# Patient Record
Sex: Female | Born: 1945 | Race: Black or African American | Hispanic: No | Marital: Married | Smoking: Former smoker
Health system: Southern US, Community
[De-identification: ages and names within clinical notes are randomized; demographics above are authoritative.]

## PROBLEM LIST (undated history)

## (undated) DIAGNOSIS — I10 Essential (primary) hypertension: Secondary | ICD-10-CM

## (undated) HISTORY — PX: BREAST SURGERY: SHX581

## (undated) HISTORY — PX: TUBAL LIGATION: SHX77

## (undated) HISTORY — PX: APPENDECTOMY: SHX54

## (undated) HISTORY — PX: TONSILLECTOMY: SUR1361

---

## 2008-09-21 HISTORY — PX: BREAST EXCISIONAL BIOPSY: SUR124

## 2008-11-02 ENCOUNTER — Encounter: Admission: RE | Admit: 2008-11-02 | Discharge: 2008-11-02 | Payer: Self-pay | Admitting: Obstetrics and Gynecology

## 2008-11-15 ENCOUNTER — Encounter: Admission: RE | Admit: 2008-11-15 | Discharge: 2008-11-15 | Payer: Self-pay | Admitting: Obstetrics and Gynecology

## 2008-12-07 ENCOUNTER — Encounter: Admission: RE | Admit: 2008-12-07 | Discharge: 2008-12-07 | Payer: Self-pay | Admitting: General Surgery

## 2008-12-11 ENCOUNTER — Ambulatory Visit (HOSPITAL_BASED_OUTPATIENT_CLINIC_OR_DEPARTMENT_OTHER): Admission: RE | Admit: 2008-12-11 | Discharge: 2008-12-11 | Payer: Self-pay | Admitting: General Surgery

## 2008-12-11 ENCOUNTER — Encounter (INDEPENDENT_AMBULATORY_CARE_PROVIDER_SITE_OTHER): Payer: Self-pay | Admitting: General Surgery

## 2009-07-10 ENCOUNTER — Encounter: Admission: RE | Admit: 2009-07-10 | Discharge: 2009-07-10 | Payer: Self-pay | Admitting: Obstetrics and Gynecology

## 2010-03-17 IMAGING — MG MM DIAGNOSTIC UNILATERAL L
3 series · 3 of 3 positions shown · non-contrast
Comparison: January 06, 2007

CLINICAL DATA: Spontaneous bloody discharge from the left nipple

DIGITAL DIAGNOSTIC LEFT MAMMOGRAM

[L CC]
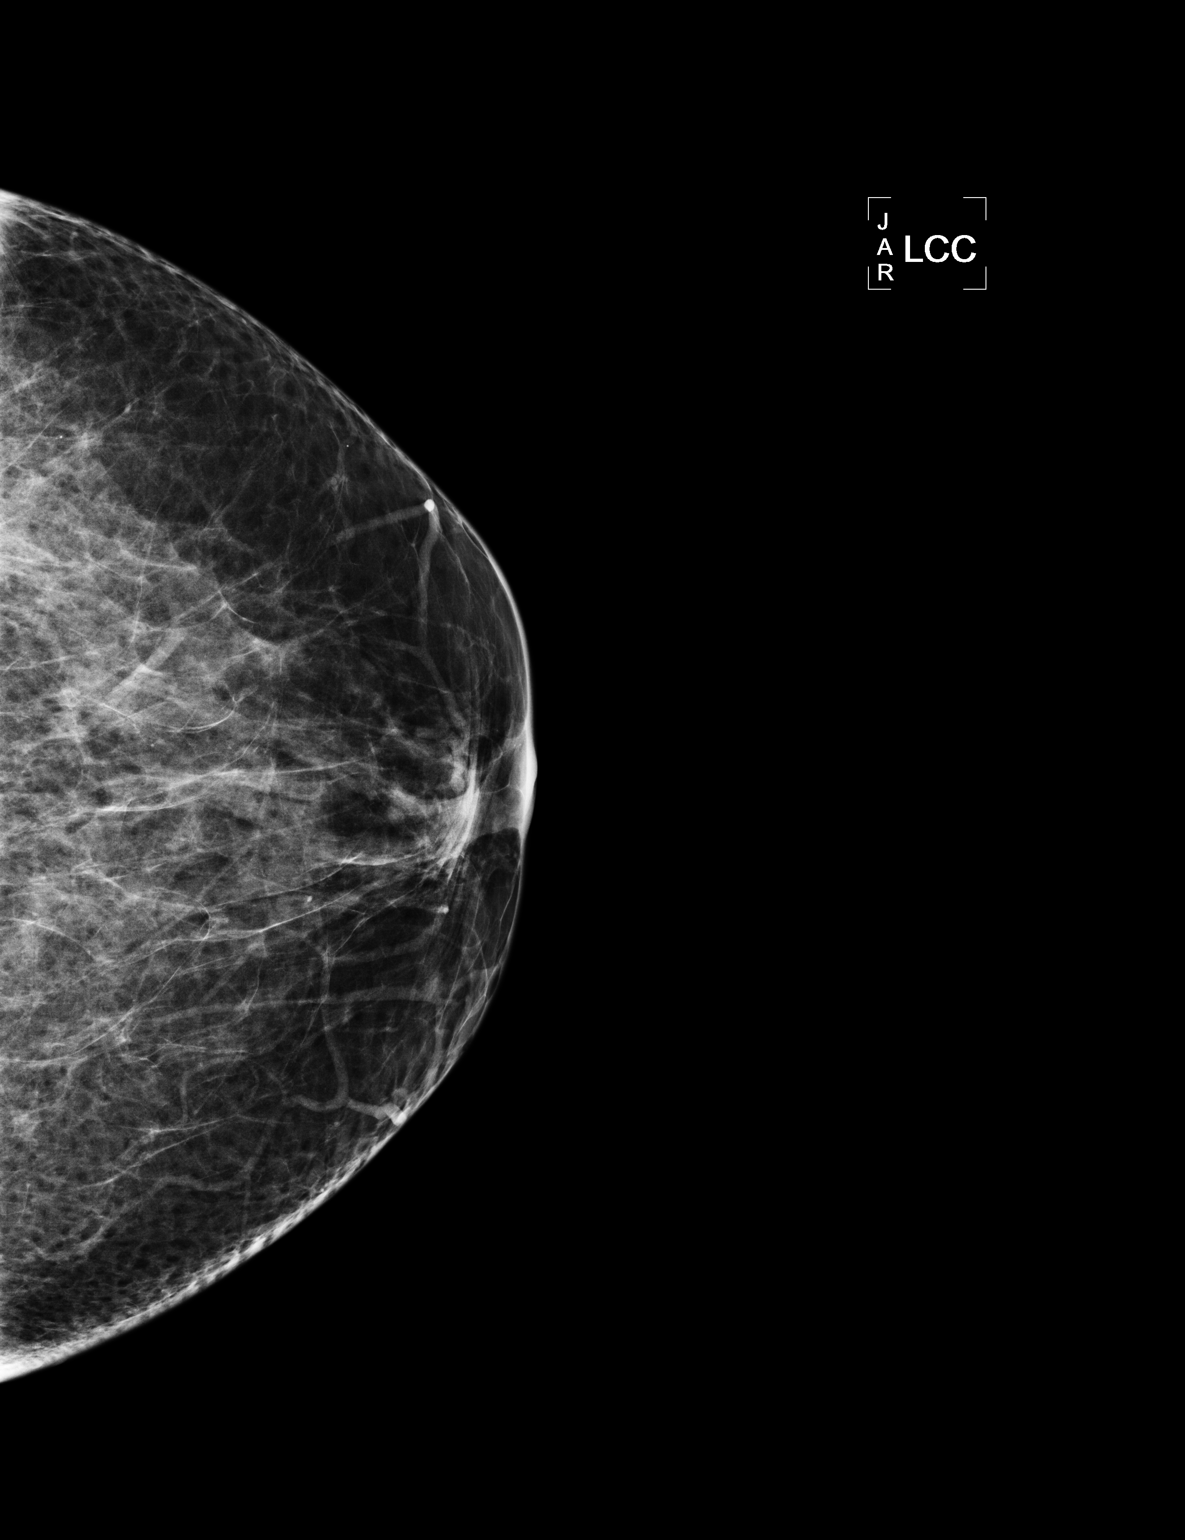

[L MLO (1 of 2)]
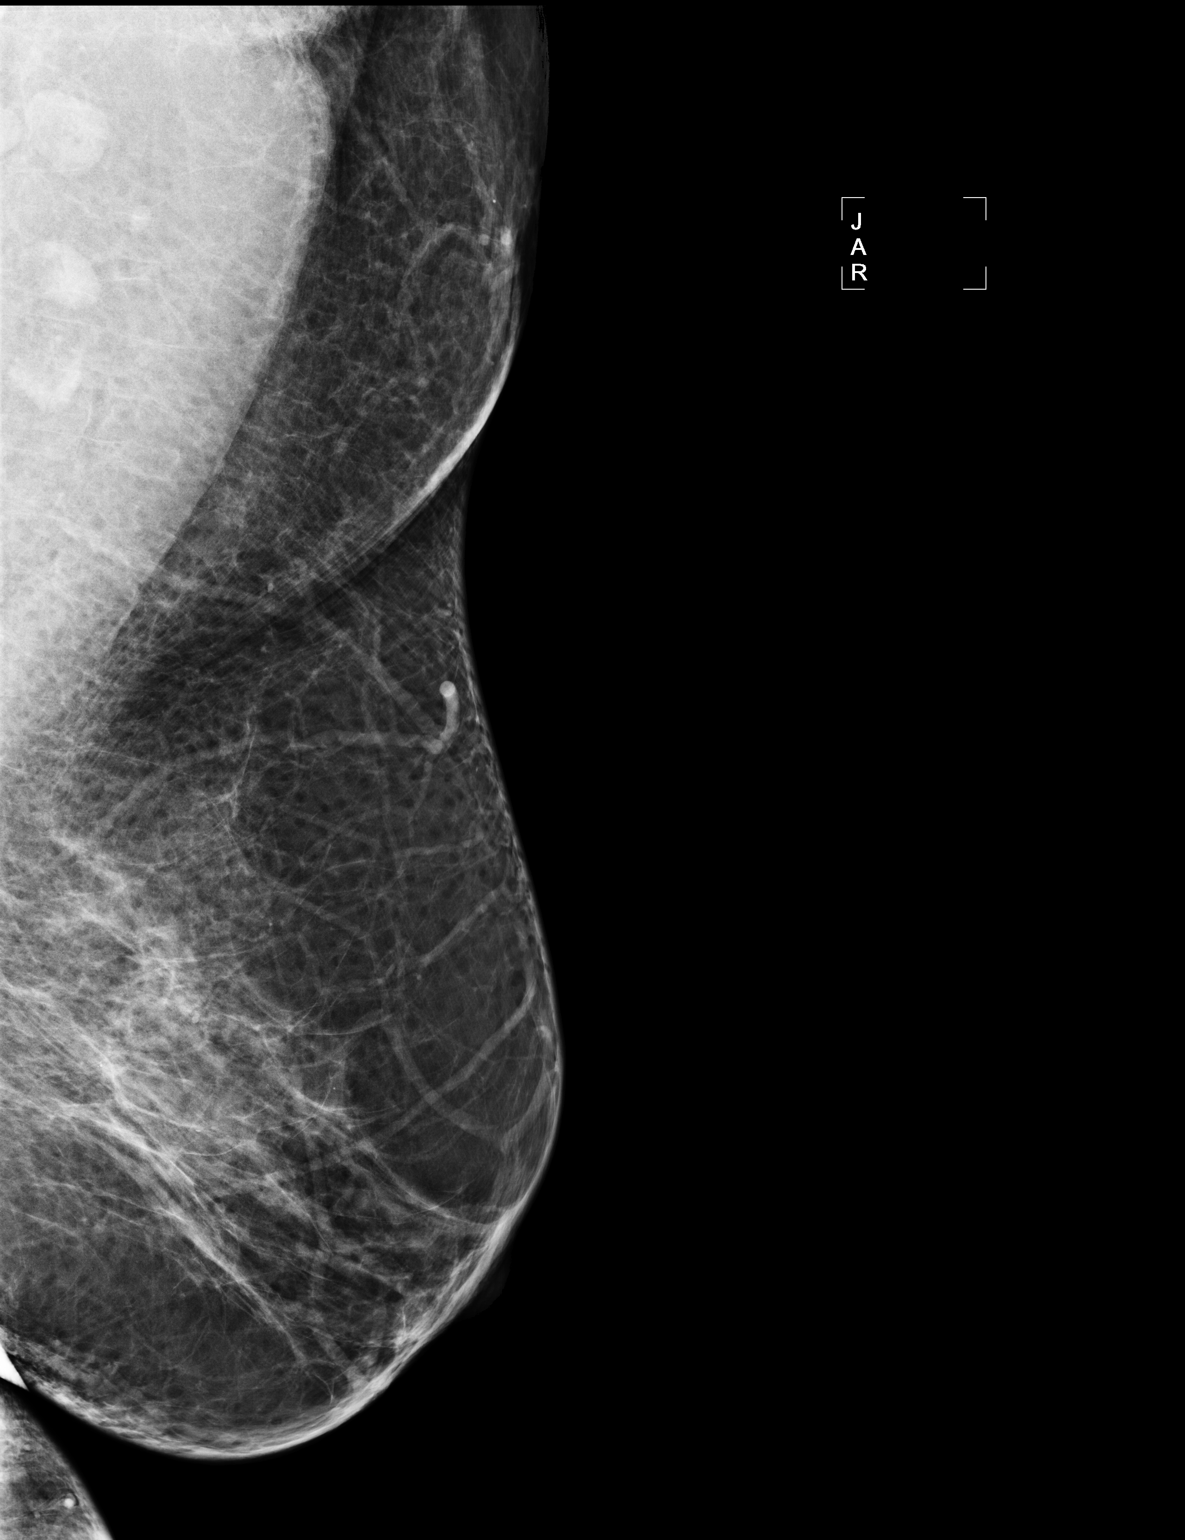

[L MLO (2 of 2)]
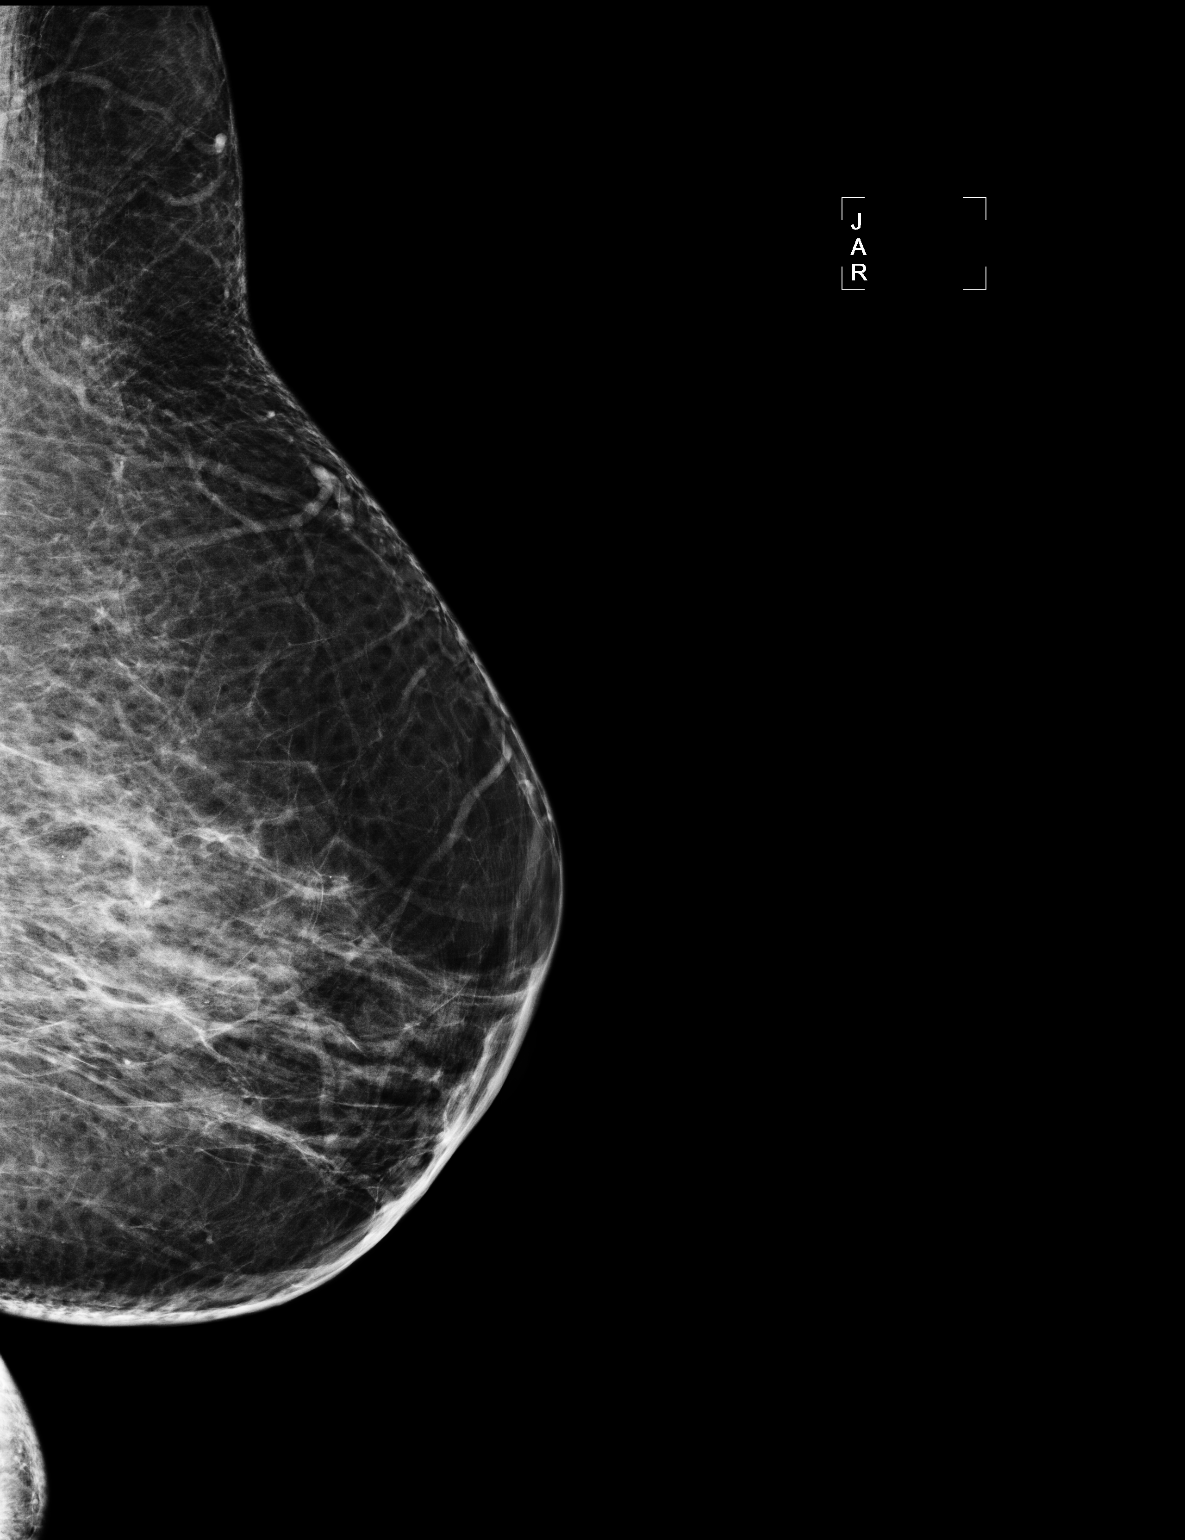

[3 of 3 positions shown; findings below may reference images not displayed]

FINDINGS: The fibroglandular parenchymal pattern within the left
breast is stable.  No dominant masses, suspicious calcifications,
or areas of architectural distortion are identified.

On physical exam, I elicit bloody discharge from a single duct at
the five o'clock position of the left nipple.  A ductogram was
attempted, but unsuccessful today.
IMPRESSION: Normal left mammogram.  Unsuccessful attempted at ductogram.  The
patient has been scheduled to return in 1 week for a second attempt
at ductogram after lidocaine cream has been applied to the nipple
several hours prior to her appointment.

Right mammogram is due in December 2008.

Surgical consultation to discuss duct excision is recommended,
regardless of the ductogram results because of the suspicious left
nipple discharge.

BI-RADS CATEGORY 2:  Benign finding(s).

REF:B1 DICTATED: 11/02/2008 [DATE]

## 2010-04-21 IMAGING — CR DG CHEST 2V
2 series · 2 of 2 positions shown · non-contrast
Comparison: None

CLINICAL DATA: Left breast nipple discharge.  Hypertension.

CHEST - 2 VIEW

[w chest pa]
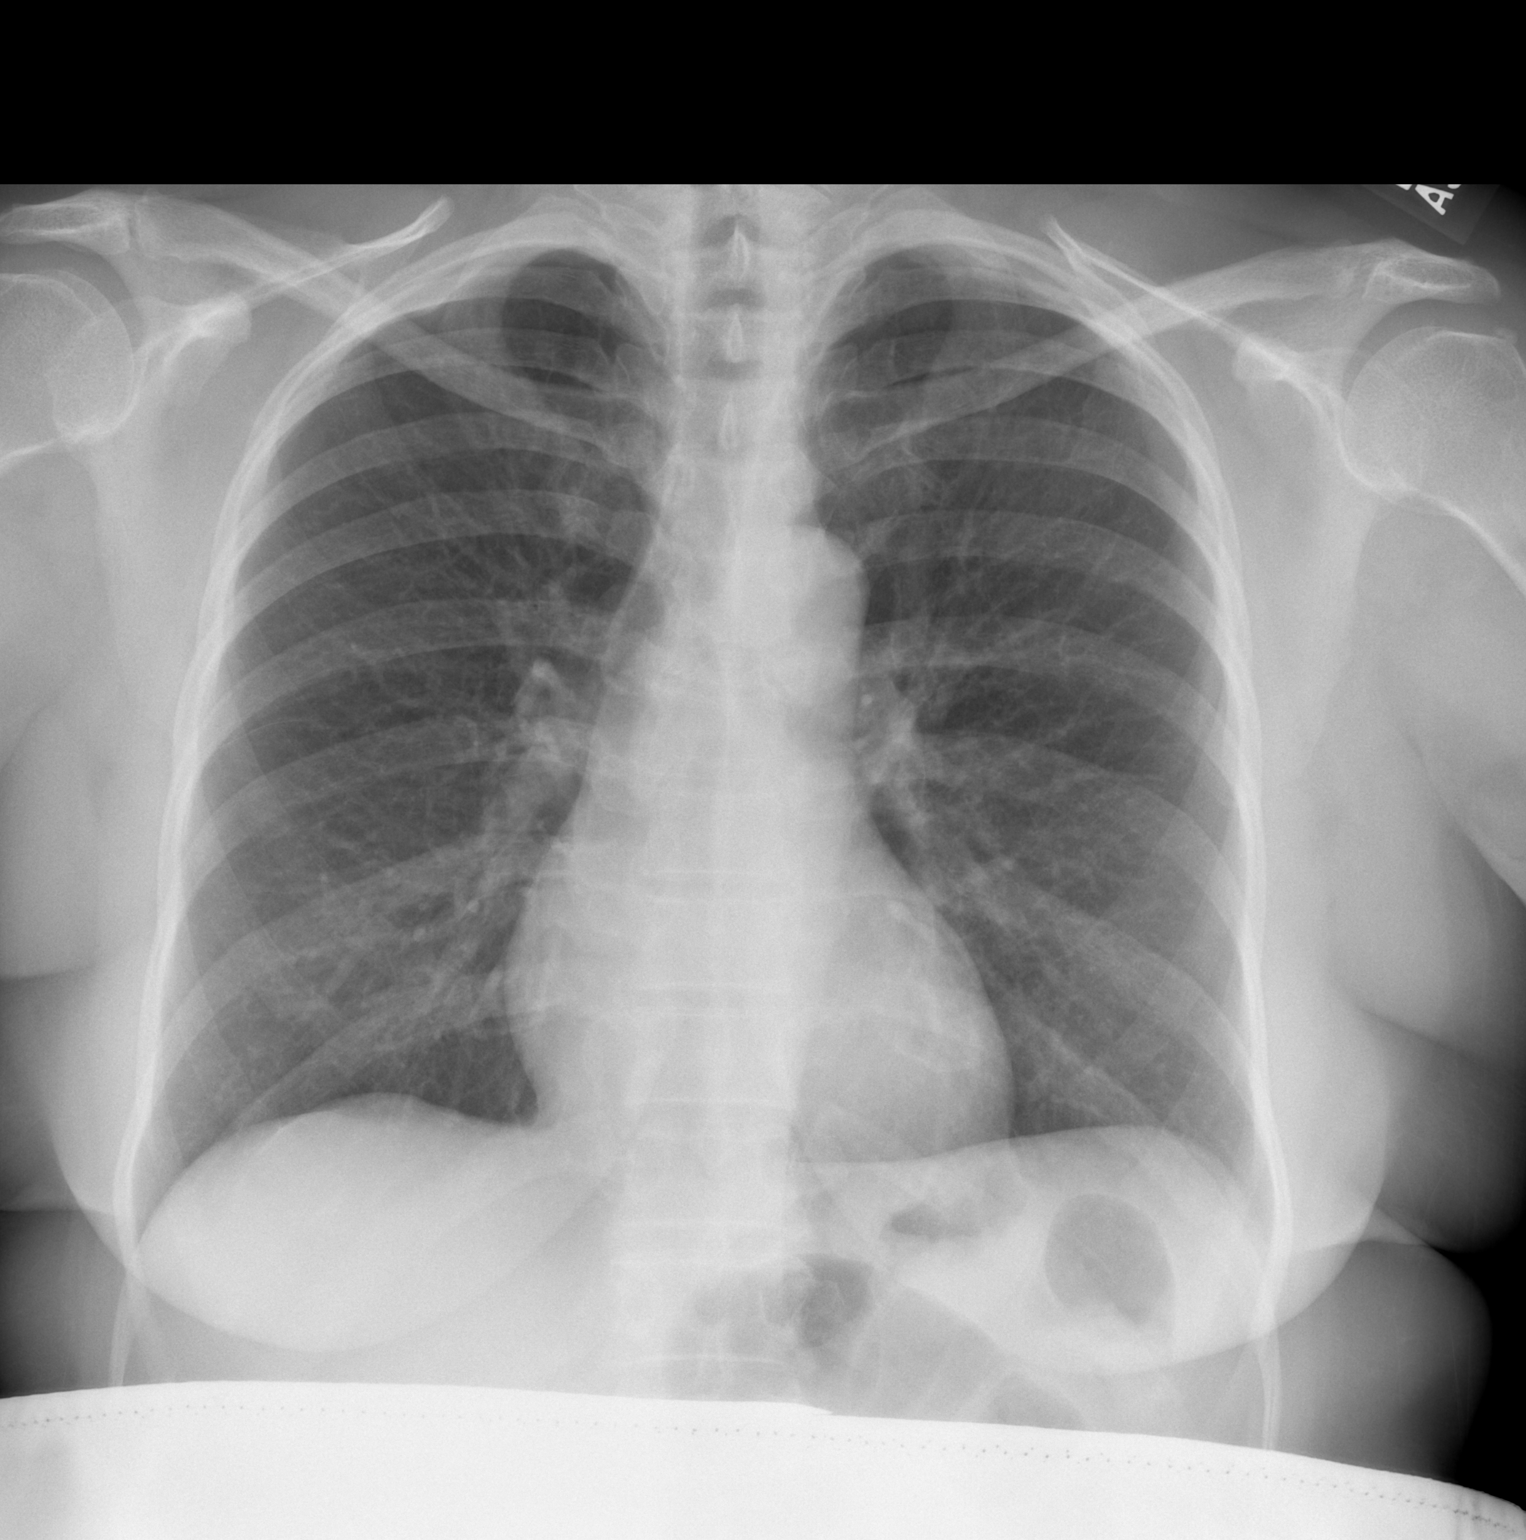

[w chest lat]
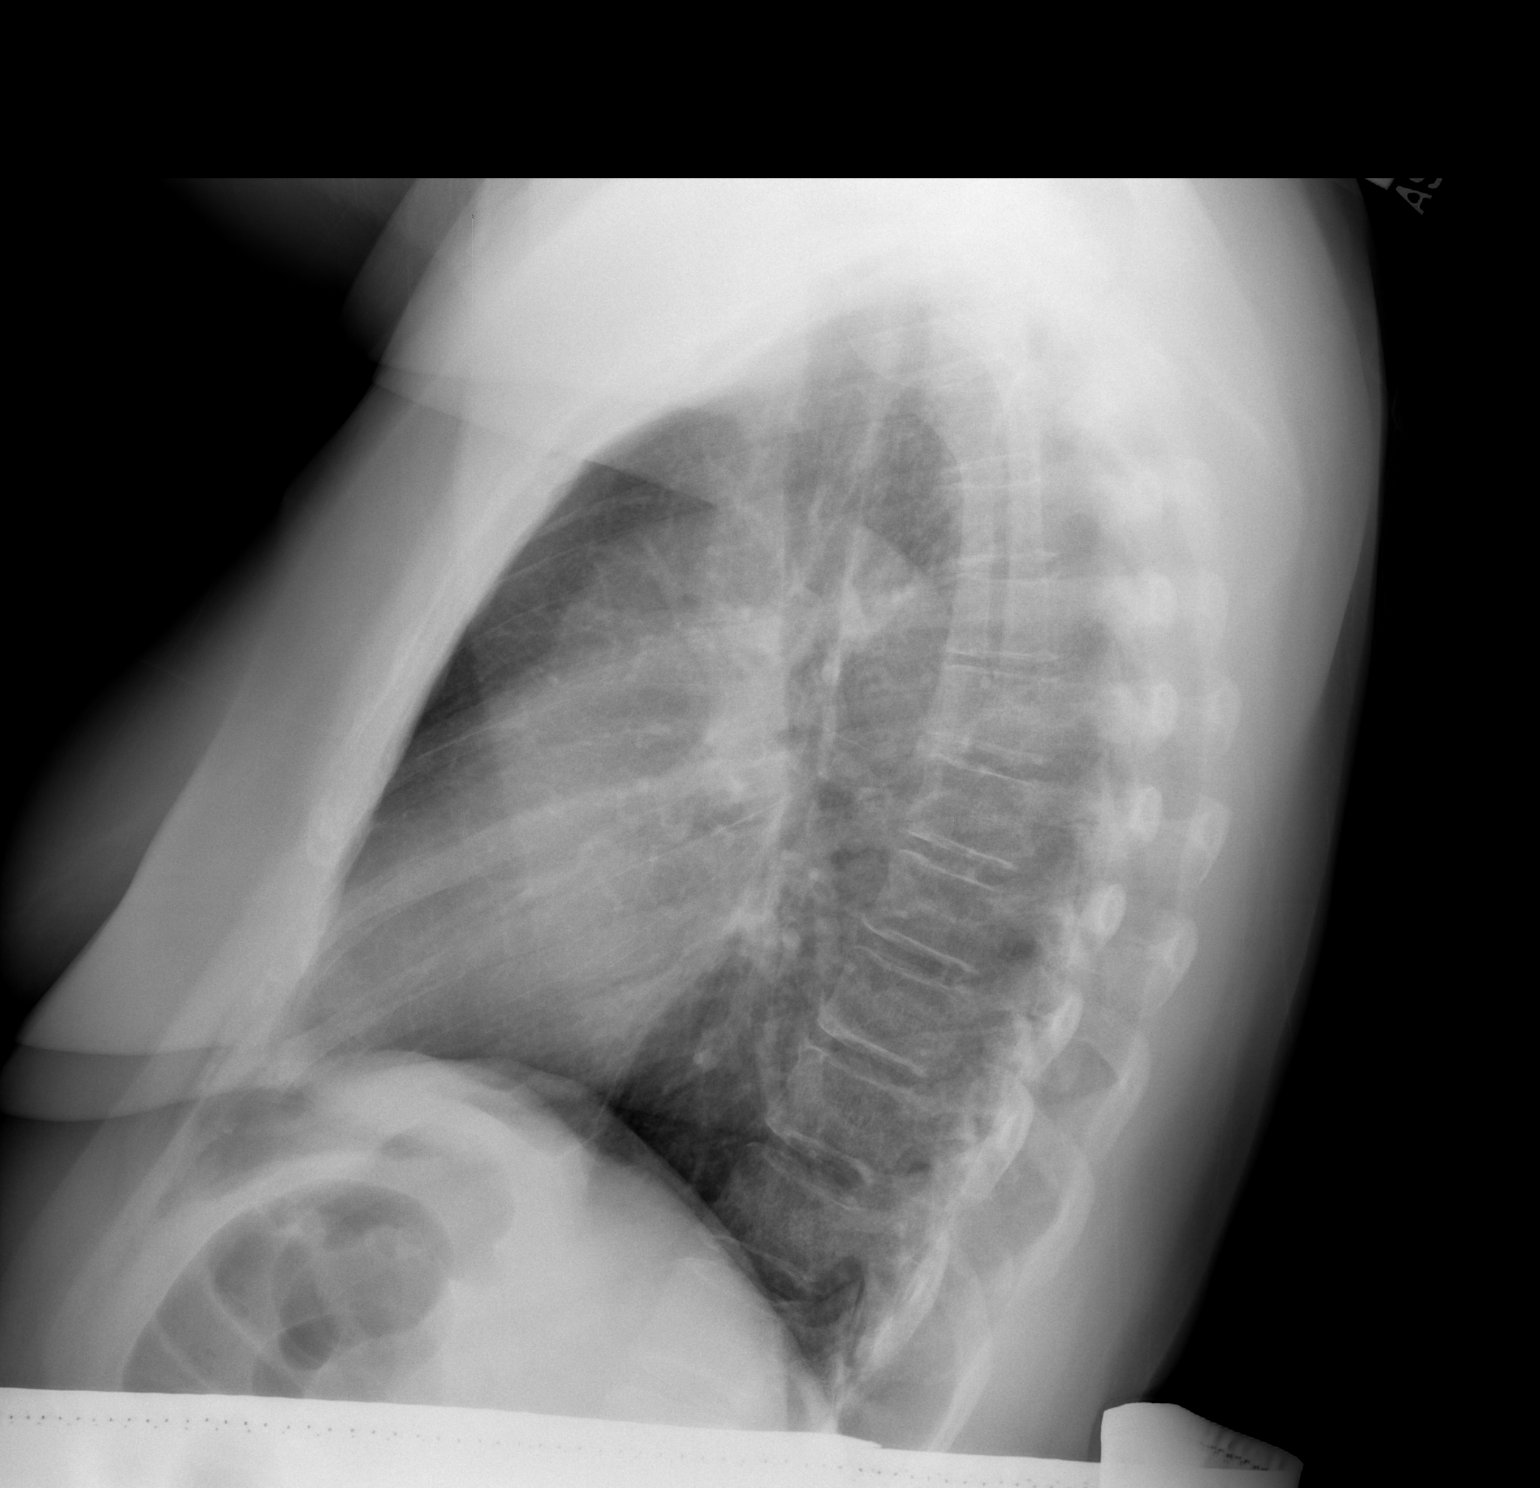

[2 of 2 positions shown; findings below may reference images not displayed]

FINDINGS: Normal cardiomediastinal silhouette size contours.  Lungs
clear of an active process.  Bony thorax is intact.
IMPRESSION: No active chest disease.

## 2010-07-11 ENCOUNTER — Encounter: Admission: RE | Admit: 2010-07-11 | Discharge: 2010-07-11 | Payer: Self-pay | Admitting: Obstetrics and Gynecology

## 2011-01-01 LAB — DIFFERENTIAL
Basophils Relative: 0 % (ref 0–1)
Eosinophils Relative: 1 % (ref 0–5)
Monocytes Absolute: 0.4 10*3/uL (ref 0.1–1.0)
Monocytes Relative: 5 % (ref 3–12)
Neutro Abs: 5.6 10*3/uL (ref 1.7–7.7)

## 2011-01-01 LAB — CBC
HCT: 35.9 % — ABNORMAL LOW (ref 36.0–46.0)
Hemoglobin: 12.8 g/dL (ref 12.0–15.0)
MCHC: 35.5 g/dL (ref 30.0–36.0)
RBC: 4.09 MIL/uL (ref 3.87–5.11)

## 2011-01-01 LAB — BASIC METABOLIC PANEL
CO2: 28 mEq/L (ref 19–32)
Calcium: 9 mg/dL (ref 8.4–10.5)
Chloride: 105 mEq/L (ref 96–112)
GFR calc Af Amer: >60 mL/min (ref >60)
Glucose, Bld: 83 mg/dL (ref 70–99)
Potassium: 4.5 mEq/L (ref 3.5–5.1)
Sodium: 138 mEq/L (ref 135–145)

## 2011-02-03 NOTE — Op Note (Signed)
NAMEMARLICIA, SROKA NO.:  [id number]   MEDICAL RECORD NO.:  [id number]          PATIENT TYPE:  AMB   LOCATION:  DSC                          FACILITY:  [facility]   PHYSICIAN:  Juanetta Gosling, MDDATE OF BIRTH:  03-24-1946   DATE OF PROCEDURE:  12/11/2008  DATE OF DISCHARGE:                               OPERATIVE REPORT   PREOPERATIVE DIAGNOSIS:  Left breast bloody nipple discharge.   POSTOPERATIVE DIAGNOSIS:  Left breast bloody nipple discharge.   PROCEDURE:  Left breast duct excision.   SURGEON:  Juanetta Gosling, MD   ASSISTANT:  None.   ANESTHESIA:  General.   SPECIMENS:  Left breast tissue to pathology.   ESTIMATED BLOOD LOSS:  Minimal.   COMPLICATIONS:  None.   DISPOSITION:  To PACU in stable condition.   INDICATIONS:  Ms. Fellows is a 65 year old female who noticed brown  discharge from her left nipple in January 2009.  In January 2010, she  noticed some bright red blood associated with this as well.  She  underwent a diagnostic left mammogram which was normal and then she  underwent a ductogram which showed a 0.7-cm filling defect, 1.1 cm from  the nipple in the 6 o'clock position.  She and I discussed a left breast  duct excision with nipple exploration.  I was able to constantly  identify this duct on examination prior to going to the operating room.   PROCEDURE:  After informed consent was obtained, the patient was taken  to the operating room.  She was administered 1 g of intravenous  cefazolin.  Sequential compression devices were placed on the lower  extremities prior to operation.  She underwent general anesthesia  without complication.  Her left breast was then prepped and draped in a  standard sterile surgical fashion.  A surgical time-out was then  performed.   A circumareolar incision was then made in the lateral portion of her  left nipple areolar complex.  Dissection was carried out down to the  level of the breast tissue.   I inserted a lacrimal duct probe after I  was able to express the discharge that was present.  I removed a segment  of breast tissue that included the area where this ductal tissue was  located.  I removed this to approximately a depth of 3 cm given her  preoperative findings.  Following this, there were no other palpable  abnormalities in the cavity.  I then irrigated the cavity and obtain  hemostasis.  Following this,  I closed the dermis with a 3-0 Vicryl and subcuticular tissue, the  closure was performed with 4-0 Monocryl.  Steri-Strips and sterile  dressing were placed over this.  A 10 mL of 0.25% Marcaine were  infiltrated into wound following this.  She tolerated this well and was  extubated in the OR and transferred to the recovery room in stable  condition.      Juanetta Gosling, MD  Electronically Signed     MCW/MEDQ  D:  12/11/2008  T:  12/12/2008  Job:  [id number]   cc:  Maxie Better, M.D.

## 2011-06-30 ENCOUNTER — Other Ambulatory Visit: Payer: Self-pay | Admitting: Obstetrics and Gynecology

## 2011-06-30 DIAGNOSIS — Z1231 Encounter for screening mammogram for malignant neoplasm of breast: Secondary | ICD-10-CM

## 2011-07-14 ENCOUNTER — Ambulatory Visit
Admission: RE | Admit: 2011-07-14 | Discharge: 2011-07-14 | Disposition: A | Discharge status: Home / Self Care | Payer: Medicare Other | Source: Ambulatory Visit | Attending: Obstetrics and Gynecology | Admitting: Obstetrics and Gynecology

## 2011-07-14 DIAGNOSIS — Z1231 Encounter for screening mammogram for malignant neoplasm of breast: Secondary | ICD-10-CM

## 2012-05-04 DIAGNOSIS — R51 Headache: Secondary | ICD-10-CM

## 2012-06-22 ENCOUNTER — Other Ambulatory Visit: Payer: Self-pay | Admitting: Obstetrics and Gynecology

## 2012-06-22 DIAGNOSIS — Z1231 Encounter for screening mammogram for malignant neoplasm of breast: Secondary | ICD-10-CM

## 2012-07-15 ENCOUNTER — Ambulatory Visit
Admission: RE | Admit: 2012-07-15 | Discharge: 2012-07-15 | Disposition: A | Discharge status: Home / Self Care | Payer: Medicare Other | Source: Ambulatory Visit | Attending: Obstetrics and Gynecology | Admitting: Obstetrics and Gynecology

## 2012-07-15 DIAGNOSIS — Z1231 Encounter for screening mammogram for malignant neoplasm of breast: Secondary | ICD-10-CM

## 2013-06-13 ENCOUNTER — Other Ambulatory Visit: Payer: Self-pay

## 2013-06-13 DIAGNOSIS — Z1231 Encounter for screening mammogram for malignant neoplasm of breast: Secondary | ICD-10-CM

## 2013-07-17 ENCOUNTER — Ambulatory Visit
Admission: RE | Admit: 2013-07-17 | Discharge: 2013-07-17 | Disposition: A | Discharge status: Home / Self Care | Payer: Medicare Other | Source: Ambulatory Visit

## 2013-07-17 DIAGNOSIS — Z1231 Encounter for screening mammogram for malignant neoplasm of breast: Secondary | ICD-10-CM

## 2013-08-27 ENCOUNTER — Emergency Department (HOSPITAL_COMMUNITY)
Admission: EM | Admit: 2013-08-27 | Discharge: 2013-08-27 | Disposition: A | Discharge status: Home / Self Care | Payer: Medicare Other | Attending: Emergency Medicine | Admitting: Emergency Medicine

## 2013-08-27 ENCOUNTER — Encounter (HOSPITAL_COMMUNITY): Payer: Self-pay | Admitting: Emergency Medicine

## 2013-08-27 DIAGNOSIS — Z79899 Other long term (current) drug therapy: Secondary | ICD-10-CM | POA: Insufficient documentation

## 2013-08-27 DIAGNOSIS — M109 Gout, unspecified: Secondary | ICD-10-CM | POA: Insufficient documentation

## 2013-08-27 DIAGNOSIS — Z87891 Personal history of nicotine dependence: Secondary | ICD-10-CM | POA: Insufficient documentation

## 2013-08-27 DIAGNOSIS — I10 Essential (primary) hypertension: Secondary | ICD-10-CM | POA: Insufficient documentation

## 2013-08-27 DIAGNOSIS — Z7982 Long term (current) use of aspirin: Secondary | ICD-10-CM | POA: Insufficient documentation

## 2013-08-27 HISTORY — DX: Essential (primary) hypertension: I10

## 2013-08-27 MED ORDER — HYDROCODONE-ACETAMINOPHEN 5-325 MG PO TABS: 1.0000 | ORAL_TABLET | ORAL | Status: DC | PRN

## 2013-08-27 MED ORDER — PREDNISONE 20 MG PO TABS: ORAL_TABLET | ORAL | Status: DC

## 2013-08-27 MED ORDER — HYDROCODONE-ACETAMINOPHEN 5-325 MG PO TABS
1.0000 | ORAL_TABLET | Freq: Once | ORAL | Status: AC
  Administered 2013-08-27: 1 via ORAL
  Filled 2013-08-27: qty 1

## 2013-08-27 MED ORDER — PREDNISONE 50 MG PO TABS
60.0000 mg | ORAL_TABLET | Freq: Once | ORAL | Status: AC
  Administered 2013-08-27: 60 mg via ORAL
  Filled 2013-08-27 (×2): qty 1

## 2013-08-27 NOTE — ED Notes (Signed)
Pt states pain to right foot, along with swelling began yesterday. States she is unable to bend her toes. States same occurred a few days ago but got better, before returning yesterday.

## 2013-08-27 NOTE — ED Provider Notes (Signed)
CSN: [id number]     Arrival date & time 08/27/13  1009 History  This chart was scribed for Doug Sou, MD by Dorothey Baseman, ED Scribe. This patient was seen in room [facility] and the patient's care was started at 11:50 AM.    Chief Complaint  Patient presents with  . Foot Pain  . Foot Swelling   The history is provided by the patient. No language interpreter was used.   HPI Comments: Natasha Flores is a 67 y.o. female who presents to the Emergency Department complaining of a constant pain to the right foot with associated swelling onset yesterday that is exacerbated with walking and bearing weight. She denies any potential injury or trauma to the area. Patient reports that the bedsheet resting on her right foot causing exquisite pain last night Patient reports taking Tylenol last night at home without relief. Patient reports a history of HTN and denies any other pertinent medical history. Patient is a non-smoker and does not drink.   PCP- Dr. Vernell Leep  Past Medical History  Diagnosis Date  . Hypertension    Past Surgical History  Procedure Laterality Date  . Tonsillectomy    . Tubal ligation    . Appendectomy    . Breast surgery     No family history on file. History  Substance Use Topics  . Smoking status: Former Games developer  . Smokeless tobacco: Not on file  . Alcohol Use: No   OB History   Grav Para Term Preterm Abortions TAB SAB Ect Mult Living                 Review of Systems  Constitutional: Negative.   Musculoskeletal: Positive for arthralgias and joint swelling.       Right foot  Skin: Negative.   Allergic/Immunologic: Negative.   Neurological: Negative.   Psychiatric/Behavioral: Negative.     Allergies  Codeine  Home Medications   Current Outpatient Rx  Name  Route  Sig  Dispense  Refill  . aspirin EC 81 MG tablet   Oral   Take 81 mg by mouth daily.         . hydrochlorothiazide (MICROZIDE) 12.5 MG capsule   Oral   Take 12.5 mg by mouth daily.          Marland Kitchen loratadine (CLARITIN) 10 MG tablet   Oral   Take 10 mg by mouth daily.         . ramipril (ALTACE) 10 MG capsule   Oral   Take 10 mg by mouth daily.          Triage Vitals: BP 137/76  Pulse 78  Temp(Src) 98.3 F (36.8 C) (Oral)  Resp 20  Ht 5\' 1"  (1.549 m)  Wt 181 lb (82.101 kg)  BMI 34.22 kg/m2  SpO2 100%  Physical Exam  Nursing note and vitals reviewed. Constitutional: She appears well-developed and well-nourished.  HENT:  Head: Normocephalic and atraumatic.  Eyes: Conjunctivae and EOM are normal.  Neck: Neck supple.  Cardiovascular: Normal rate.   No murmur heard. Pulmonary/Chest: Effort normal.  Abdominal: She exhibits no distension.  Musculoskeletal: Normal range of motion. She exhibits no edema and no tenderness.  Warmth, erythema, and tenderness at the first MTP joint on the right. DP pulses are 2+ bilaterally.   Neurological: She is alert. Coordination normal.  Skin: Skin is warm and dry. No rash noted.  Psychiatric: She has a normal mood and affect.    ED Course  Procedures (including critical  care time)  DIAGNOSTIC STUDIES: Oxygen Saturation is 100% on room air, normal by my interpretation.    COORDINATION OF CARE: 11:52 AM- Discussed that symptoms appear to be due to a gout and that imaging will not be necessary today in the ED. Will discharge patient with crutches, pain medication, and prednisone to manage symptoms. Advised patient to follow up with her PCP if symptoms do not improve in a few days. Discussed treatment plan with patient at bedside and patient verbalized agreement.     Labs Review Labs Reviewed - No data to display Imaging Review No results found.  EKG Interpretation   None       MDM  No diagnosis found. Strongly suspect gout based on exam and symptoms. Plan prescription Norco, prednisone, crutches Followup with PMD if not improved in 2 days Diagnosis gouty arthritis   I personally performed the services  described in this documentation, which was scribed in my presence. The recorded information has been reviewed and considered.    Doug Sou, MD 08/27/13 1212

## 2014-06-04 ENCOUNTER — Other Ambulatory Visit: Payer: Self-pay

## 2014-06-04 DIAGNOSIS — Z1231 Encounter for screening mammogram for malignant neoplasm of breast: Secondary | ICD-10-CM

## 2014-07-18 ENCOUNTER — Ambulatory Visit
Admission: RE | Admit: 2014-07-18 | Discharge: 2014-07-18 | Disposition: A | Discharge status: Home / Self Care | Payer: Medicare Other | Source: Ambulatory Visit

## 2014-07-18 ENCOUNTER — Encounter (INDEPENDENT_AMBULATORY_CARE_PROVIDER_SITE_OTHER): Payer: Self-pay

## 2014-07-18 DIAGNOSIS — Z1231 Encounter for screening mammogram for malignant neoplasm of breast: Secondary | ICD-10-CM

## 2015-06-24 ENCOUNTER — Other Ambulatory Visit: Payer: Self-pay

## 2015-06-24 DIAGNOSIS — Z1231 Encounter for screening mammogram for malignant neoplasm of breast: Secondary | ICD-10-CM

## 2016-06-26 ENCOUNTER — Other Ambulatory Visit: Payer: Self-pay | Admitting: Obstetrics and Gynecology

## 2016-06-26 DIAGNOSIS — Z1231 Encounter for screening mammogram for malignant neoplasm of breast: Secondary | ICD-10-CM

## 2016-07-03 ENCOUNTER — Ambulatory Visit
Admission: RE | Admit: 2016-07-03 | Discharge: 2016-07-03 | Disposition: A | Discharge status: Home / Self Care | Payer: Medicare Other | Source: Ambulatory Visit | Attending: Obstetrics and Gynecology | Admitting: Obstetrics and Gynecology

## 2016-07-03 DIAGNOSIS — Z1231 Encounter for screening mammogram for malignant neoplasm of breast: Secondary | ICD-10-CM

## 2016-10-28 ENCOUNTER — Encounter (HOSPITAL_COMMUNITY): Payer: Self-pay | Admitting: Emergency Medicine

## 2016-10-28 ENCOUNTER — Emergency Department (HOSPITAL_COMMUNITY)
Admission: EM | Admit: 2016-10-28 | Discharge: 2016-10-28 | Disposition: A | Discharge status: Home / Self Care | Payer: Medicare Other | Attending: Emergency Medicine | Admitting: Emergency Medicine

## 2016-10-28 ENCOUNTER — Emergency Department (HOSPITAL_COMMUNITY): Payer: Medicare Other

## 2016-10-28 DIAGNOSIS — Z79899 Other long term (current) drug therapy: Secondary | ICD-10-CM | POA: Diagnosis not present

## 2016-10-28 DIAGNOSIS — R05 Cough: Secondary | ICD-10-CM | POA: Diagnosis present

## 2016-10-28 DIAGNOSIS — Z7982 Long term (current) use of aspirin: Secondary | ICD-10-CM | POA: Insufficient documentation

## 2016-10-28 DIAGNOSIS — Z87891 Personal history of nicotine dependence: Secondary | ICD-10-CM | POA: Diagnosis not present

## 2016-10-28 DIAGNOSIS — J09X2 Influenza due to identified novel influenza A virus with other respiratory manifestations: Secondary | ICD-10-CM | POA: Insufficient documentation

## 2016-10-28 DIAGNOSIS — J101 Influenza due to other identified influenza virus with other respiratory manifestations: Secondary | ICD-10-CM

## 2016-10-28 DIAGNOSIS — I10 Essential (primary) hypertension: Secondary | ICD-10-CM | POA: Insufficient documentation

## 2016-10-28 LAB — CBC WITH DIFFERENTIAL/PLATELET
Basophils Absolute: 0 10*3/uL (ref 0.0–0.1)
Basophils Relative: 0 %
EOS ABS: 0 10*3/uL (ref 0.0–0.7)
EOS PCT: 0 %
HCT: 37.1 % (ref 36.0–46.0)
HEMOGLOBIN: 12.6 g/dL (ref 12.0–15.0)
LYMPHS ABS: 0.6 10*3/uL — AB (ref 0.7–4.0)
LYMPHS PCT: 5 %
MCH: 29.6 pg (ref 26.0–34.0)
MCHC: 34 g/dL (ref 30.0–36.0)
MCV: 87.3 fL (ref 78.0–100.0)
MONOS PCT: 4 %
Monocytes Absolute: 0.5 10*3/uL (ref 0.1–1.0)
NEUTROS PCT: 91 %
Neutro Abs: 11 10*3/uL — ABNORMAL HIGH (ref 1.7–7.7)
Platelets: 241 10*3/uL (ref 150–400)
RBC: 4.25 MIL/uL (ref 3.87–5.11)
RDW: 13.7 % (ref 11.5–15.5)
WBC: 12.2 10*3/uL — ABNORMAL HIGH (ref 4.0–10.5)

## 2016-10-28 LAB — BASIC METABOLIC PANEL
Anion gap: 8 (ref 5–15)
BUN: 13 mg/dL (ref 6–20)
CHLORIDE: 102 mmol/L (ref 101–111)
CO2: 26 mmol/L (ref 22–32)
CREATININE: 1.04 mg/dL — AB (ref 0.44–1.00)
Calcium: 8.9 mg/dL (ref 8.9–10.3)
GFR calc Af Amer: >60 mL/min (ref >60)
GFR calc non Af Amer: 52 mL/min — ABNORMAL LOW (ref >60)
Glucose, Bld: 111 mg/dL — ABNORMAL HIGH (ref 65–99)
Potassium: 3.6 mmol/L (ref 3.5–5.1)
Sodium: 136 mmol/L (ref 135–145)

## 2016-10-28 LAB — INFLUENZA PANEL BY PCR (TYPE A & B)
INFLAPCR: POSITIVE — AB
Influenza B By PCR: NEGATIVE

## 2016-10-28 MED ORDER — IBUPROFEN 600 MG PO TABS: 600.0000 mg | ORAL_TABLET | Freq: Four times a day (QID) | ORAL | 0 refills | Status: AC | PRN

## 2016-10-28 MED ORDER — SODIUM CHLORIDE 0.9 % IV BOLUS (SEPSIS)
1000.0000 mL | Freq: Once | INTRAVENOUS | Status: AC
  Administered 2016-10-28: 1000 mL via INTRAVENOUS

## 2016-10-28 MED ORDER — ONDANSETRON 4 MG PO TBDP: 4.0000 mg | ORAL_TABLET | Freq: Three times a day (TID) | ORAL | 0 refills | Status: AC | PRN

## 2016-10-28 MED ORDER — ACETAMINOPHEN 325 MG PO TABS
650.0000 mg | ORAL_TABLET | Freq: Once | ORAL | Status: AC
Start: 2016-10-28 — End: 2016-10-28
  Administered 2016-10-28: 650 mg via ORAL

## 2016-10-28 MED ORDER — OSELTAMIVIR PHOSPHATE 75 MG PO CAPS: 75.0000 mg | ORAL_CAPSULE | Freq: Two times a day (BID) | ORAL | 0 refills | Status: AC

## 2016-10-28 MED ORDER — ACETAMINOPHEN 325 MG PO TABS
ORAL_TABLET | ORAL | Status: AC
  Filled 2016-10-28: qty 2

## 2016-10-28 MED ORDER — KETOROLAC TROMETHAMINE 30 MG/ML IJ SOLN
30.0000 mg | Freq: Once | INTRAMUSCULAR | Status: AC
  Administered 2016-10-28: 30 mg via INTRAVENOUS
  Filled 2016-10-28: qty 1

## 2016-10-28 NOTE — ED Provider Notes (Signed)
[facility] DEPT Provider Note   CSN: [id number] Arrival date & time: 10/28/16  1128     History   Chief Complaint Chief Complaint  Patient presents with  . Cough    HPI Natasha Flores is a 71 y.o. female.  Pt presents to the ED today with sore throat, congested cough, fever, runny nose that started yesterday.  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  Pt was given tylenol in triage for her low grade temp.      Past Medical History:  Diagnosis Date  . Hypertension     There are no active problems to display for this patient.   Past Surgical History:  Procedure Laterality Date  . APPENDECTOMY    . BREAST SURGERY    . TONSILLECTOMY    . TUBAL LIGATION      OB History    No data available       Home Medications    Prior to Admission medications   Medication Sig Start Date End Date Taking? Authorizing Provider  aspirin EC 81 MG tablet Take 81 mg by mouth daily.   Yes Historical Provider, MD  dextromethorphan-guaiFENesin (MUCINEX DM) 30-600 MG 12hr tablet Take 1 tablet by mouth 2 (two) times daily.   Yes Historical Provider, MD  hydrochlorothiazide (MICROZIDE) 12.5 MG capsule Take 12.5 mg by mouth daily.   Yes Historical Provider, MD  loratadine (CLARITIN) 10 MG tablet Take 10 mg by mouth daily.   Yes Historical Provider, MD  ramipril (ALTACE) 10 MG capsule Take 10 mg by mouth daily.   Yes Historical Provider, MD  ibuprofen (ADVIL,MOTRIN) 600 MG tablet Take 1 tablet (600 mg total) by mouth every 6 (six) hours as needed. 10/28/16   Jacalyn Lefevre, MD  ondansetron (ZOFRAN ODT) 4 MG disintegrating tablet Take 1 tablet (4 mg total) by mouth every 8 (eight) hours as needed for nausea or vomiting. 10/28/16   Jacalyn Lefevre, MD  oseltamivir (TAMIFLU) 75 MG capsule Take 1 capsule (75 mg total) by mouth every 12 (twelve) hours. 10/28/16   Jacalyn Lefevre, MD    Family History History reviewed. No pertinent family  history.  Social History Social History  Substance Use Topics  . Smoking status: Former Games developer  . Smokeless tobacco: Never Used  . Alcohol use No     Allergies   Codeine   Review of Systems Review of Systems  Constitutional: Positive for chills, fatigue and fever.  HENT: Positive for rhinorrhea and sore throat.   Respiratory: Positive for cough.   Musculoskeletal: Positive for myalgias.  All other systems reviewed and are negative.    Physical Exam Updated Vital Signs BP 151/66 (BP Location: Left Arm)   Pulse (!) 121   Temp 99.8 F (37.7 C) (Oral)   Resp 18   Ht 5' (1.524 m)   Wt 183 lb (83 kg)   SpO2 97%   BMI 35.74 kg/m   Physical Exam  Constitutional: She is oriented to person, place, and time. She appears well-developed and well-nourished.  HENT:  Head: Normocephalic and atraumatic.  Right Ear: External ear normal.  Left Ear: External ear normal.  Nose: Rhinorrhea present.  Mouth/Throat: Oropharynx is clear and moist.  Eyes: Conjunctivae and EOM are normal. Pupils are equal, round, and reactive to light.  Neck: Normal range of motion. Neck supple.  Cardiovascular: Regular rhythm, normal heart sounds and intact distal pulses.  Tachycardia present.   Pulmonary/Chest: Effort normal and  breath sounds normal.  Abdominal: Soft. Bowel sounds are normal.  Musculoskeletal: Normal range of motion.  Neurological: She is alert and oriented to person, place, and time.  Skin: Skin is warm.  Psychiatric: She has a normal mood and affect. Her behavior is normal. Judgment and thought content normal.  Nursing note and vitals reviewed.    ED Treatments / Results  Labs (all labs ordered are listed, but only abnormal results are displayed) Labs Reviewed  BASIC METABOLIC PANEL - Abnormal; Notable for the following:       Result Value   Glucose, Bld 111 (*)    Creatinine, Ser 1.04 (*)    GFR calc non Af Amer 52 (*)    All other components within normal limits  CBC  WITH DIFFERENTIAL/PLATELET - Abnormal; Notable for the following:    WBC 12.2 (*)    Neutro Abs 11.0 (*)    Lymphs Abs 0.6 (*)    All other components within normal limits  INFLUENZA PANEL BY PCR (TYPE A & B) - Abnormal; Notable for the following:    Influenza A By PCR POSITIVE (*)    All other components within normal limits    EKG  EKG Interpretation None       Radiology Dg Chest 2 View  Result Date: 10/28/2016 CLINICAL DATA:  COUGH AND FEVER SINCE YESTERDAY, HTN, FORMER SMOKER X 40+ YEARS EXAM: CHEST  2 VIEW COMPARISON:  12/07/2008 FINDINGS: Midline trachea. Normal heart size and mediastinal contours. No pleural effusion or pneumothorax. Biapical pleural thickening. Clear lungs. IMPRESSION: No acute cardiopulmonary disease. Electronically Signed   By: Jeronimo GreavesKyle  Talbot M.D.   On: 10/28/2016 12:10    Procedures Procedures (including critical care time)  Medications Ordered in ED Medications  acetaminophen (TYLENOL) tablet 650 mg (650 mg Oral Given 10/28/16 1139)  sodium chloride 0.9 % bolus 1,000 mL (1,000 mLs Intravenous New Bag/Given 10/28/16 1257)  ketorolac (TORADOL) 30 MG/ML injection 30 mg (30 mg Intravenous Given 10/28/16 1257)     Initial Impression / Assessment and Plan / ED Course  I have reviewed the triage vital signs and the nursing notes.  Pertinent labs & imaging results that were available during my care of the patient were reviewed by me and considered in my medical decision making (see chart for details).    Pt's HR is down after tylenol and 1L NS.  The pt has the flu and is told to not go back to school until 24 hours after sx have resolved.  She knows to return if worse and to f/u with pcp.  Final Clinical Impressions(s) / ED Diagnoses   Final diagnoses:  Influenza A    New Prescriptions New Prescriptions   IBUPROFEN (ADVIL,MOTRIN) 600 MG TABLET    Take 1 tablet (600 mg total) by mouth every 6 (six) hours as needed.   ONDANSETRON (ZOFRAN ODT) 4 MG  DISINTEGRATING TABLET    Take 1 tablet (4 mg total) by mouth every 8 (eight) hours as needed for nausea or vomiting.   OSELTAMIVIR (TAMIFLU) 75 MG CAPSULE    Take 1 capsule (75 mg total) by mouth every 12 (twelve) hours.     Jacalyn LefevreJulie Armonie Mettler, MD 10/28/16 1426

## 2016-10-28 NOTE — ED Triage Notes (Signed)
PT c/o sore throat, congested non-productive cough with low grade temp that started yesterday.

## 2017-06-18 ENCOUNTER — Other Ambulatory Visit: Payer: Self-pay | Admitting: Obstetrics and Gynecology

## 2017-06-18 DIAGNOSIS — Z1231 Encounter for screening mammogram for malignant neoplasm of breast: Secondary | ICD-10-CM

## 2017-07-07 ENCOUNTER — Ambulatory Visit
Admission: RE | Admit: 2017-07-07 | Discharge: 2017-07-07 | Disposition: A | Discharge status: Home / Self Care | Payer: Medicare Other | Source: Ambulatory Visit | Attending: Obstetrics and Gynecology | Admitting: Obstetrics and Gynecology

## 2017-07-07 DIAGNOSIS — Z1231 Encounter for screening mammogram for malignant neoplasm of breast: Secondary | ICD-10-CM

## 2018-03-12 IMAGING — DX DG CHEST 2V
2 series · 2 of 2 positions shown · non-contrast
Comparison: 12/07/2008

CLINICAL DATA: COUGH AND FEVER SINCE YESTERDAY, HTN, FORMER SMOKER
X 40+ YEARS

EXAM:
CHEST  2 VIEW

[chest pa]
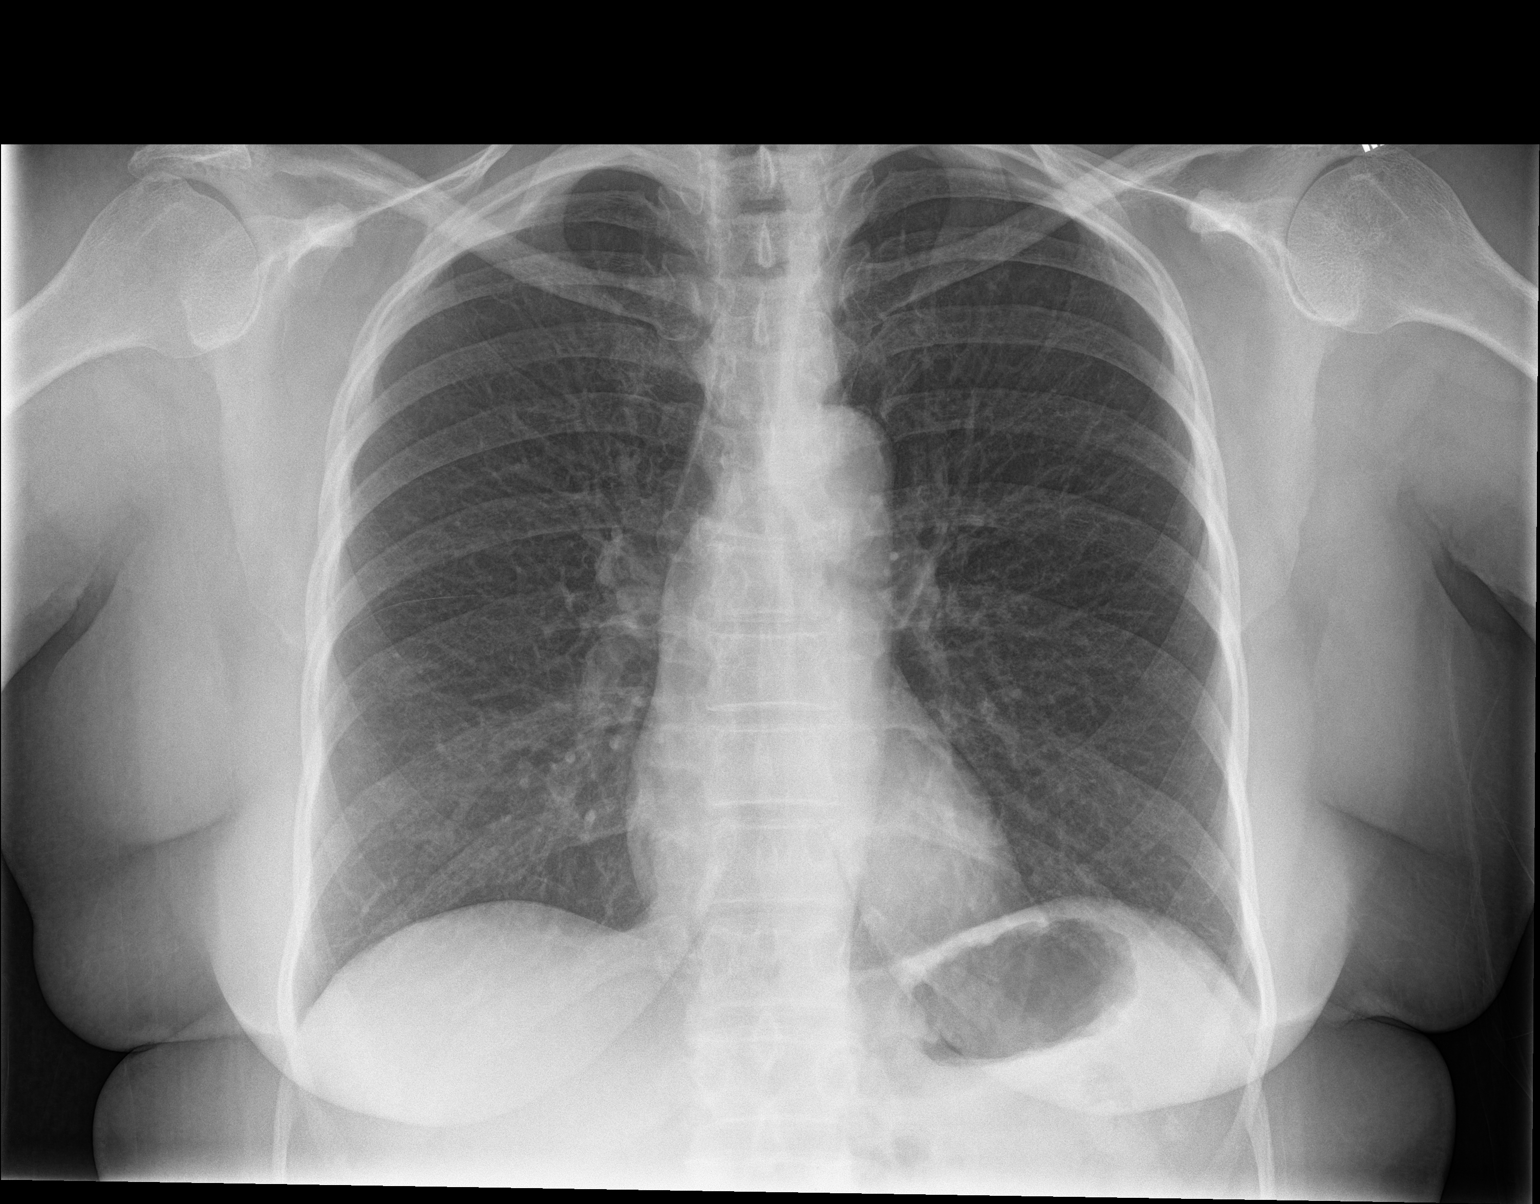

[chest lat]
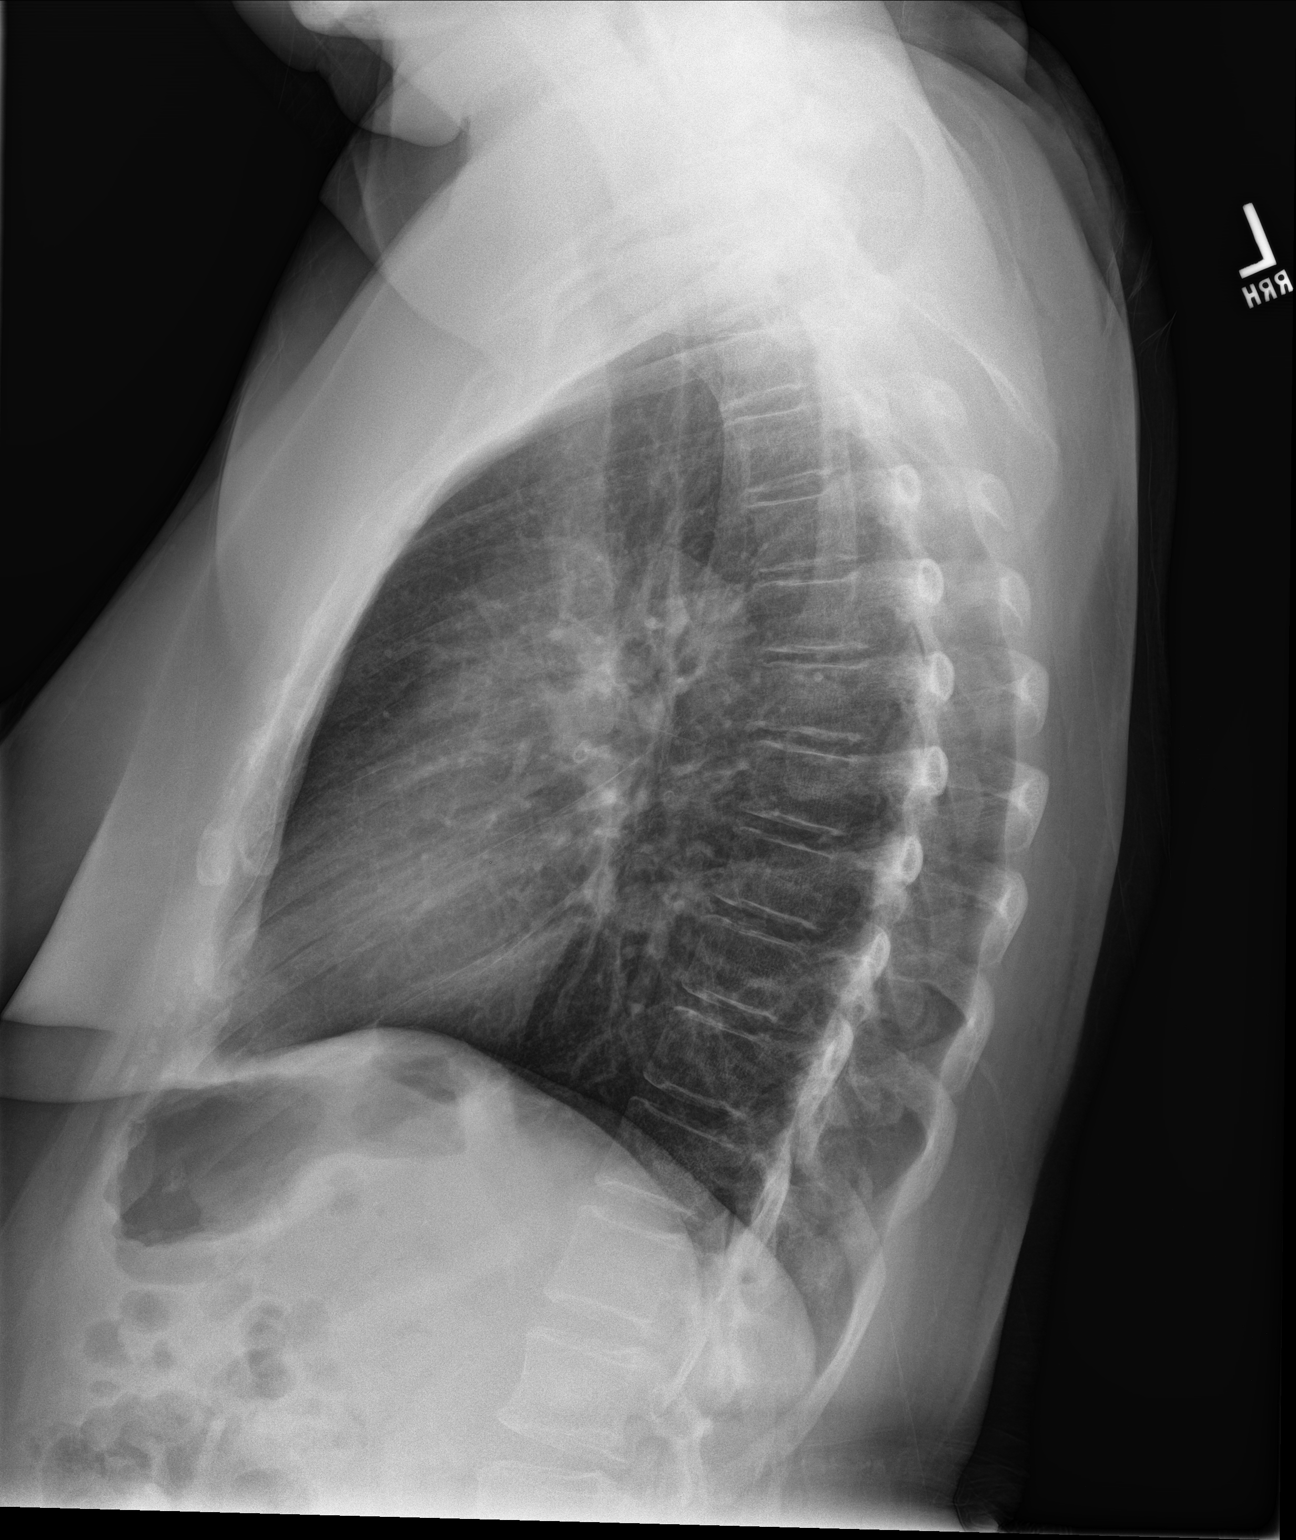

[2 of 2 positions shown; findings below may reference images not displayed]

FINDINGS: Midline trachea. Normal heart size and mediastinal contours. No
pleural effusion or pneumothorax. Biapical pleural thickening. Clear
lungs.
IMPRESSION: No acute cardiopulmonary disease.

## 2018-06-15 ENCOUNTER — Other Ambulatory Visit: Payer: Self-pay | Admitting: Obstetrics and Gynecology

## 2018-06-15 DIAGNOSIS — Z1231 Encounter for screening mammogram for malignant neoplasm of breast: Secondary | ICD-10-CM

## 2018-07-20 ENCOUNTER — Ambulatory Visit: Payer: Medicare Other

## 2018-07-20 ENCOUNTER — Ambulatory Visit
Admission: RE | Admit: 2018-07-20 | Discharge: 2018-07-20 | Disposition: A | Discharge status: Home / Self Care | Payer: Medicare Other | Source: Ambulatory Visit | Attending: Obstetrics and Gynecology | Admitting: Obstetrics and Gynecology

## 2018-07-20 DIAGNOSIS — Z1231 Encounter for screening mammogram for malignant neoplasm of breast: Secondary | ICD-10-CM

## 2019-06-09 ENCOUNTER — Other Ambulatory Visit: Payer: Self-pay | Admitting: Obstetrics and Gynecology

## 2019-06-09 DIAGNOSIS — Z1231 Encounter for screening mammogram for malignant neoplasm of breast: Secondary | ICD-10-CM

## 2019-07-25 ENCOUNTER — Ambulatory Visit: Payer: Medicare Other

## 2019-08-02 ENCOUNTER — Other Ambulatory Visit: Payer: Self-pay

## 2019-08-02 ENCOUNTER — Ambulatory Visit
Admission: RE | Admit: 2019-08-02 | Discharge: 2019-08-02 | Disposition: A | Discharge status: Home / Self Care | Payer: Medicare Other | Source: Ambulatory Visit | Attending: Obstetrics and Gynecology | Admitting: Obstetrics and Gynecology

## 2019-08-02 DIAGNOSIS — Z1231 Encounter for screening mammogram for malignant neoplasm of breast: Secondary | ICD-10-CM

## 2019-08-04 ENCOUNTER — Other Ambulatory Visit: Payer: Self-pay | Admitting: Obstetrics and Gynecology

## 2019-08-04 DIAGNOSIS — R928 Other abnormal and inconclusive findings on diagnostic imaging of breast: Secondary | ICD-10-CM

## 2019-08-16 ENCOUNTER — Other Ambulatory Visit: Payer: Self-pay

## 2019-08-16 ENCOUNTER — Ambulatory Visit
Admission: RE | Admit: 2019-08-16 | Discharge: 2019-08-16 | Disposition: A | Discharge status: Home / Self Care | Payer: Medicare Other | Source: Ambulatory Visit | Attending: Obstetrics and Gynecology | Admitting: Obstetrics and Gynecology

## 2019-08-16 ENCOUNTER — Ambulatory Visit: Payer: Medicare Other

## 2019-08-16 DIAGNOSIS — R928 Other abnormal and inconclusive findings on diagnostic imaging of breast: Secondary | ICD-10-CM

## 2020-06-28 ENCOUNTER — Other Ambulatory Visit: Payer: Self-pay | Admitting: Obstetrics and Gynecology

## 2020-06-28 DIAGNOSIS — Z1231 Encounter for screening mammogram for malignant neoplasm of breast: Secondary | ICD-10-CM

## 2020-08-05 ENCOUNTER — Ambulatory Visit
Admission: RE | Admit: 2020-08-05 | Discharge: 2020-08-05 | Disposition: A | Discharge status: Home / Self Care | Payer: Medicare Other | Source: Ambulatory Visit | Attending: Obstetrics and Gynecology | Admitting: Obstetrics and Gynecology

## 2020-08-05 ENCOUNTER — Other Ambulatory Visit: Payer: Self-pay

## 2020-08-05 DIAGNOSIS — Z1231 Encounter for screening mammogram for malignant neoplasm of breast: Secondary | ICD-10-CM

## 2021-07-02 ENCOUNTER — Other Ambulatory Visit: Payer: Self-pay | Admitting: Obstetrics and Gynecology

## 2021-07-02 DIAGNOSIS — Z1231 Encounter for screening mammogram for malignant neoplasm of breast: Secondary | ICD-10-CM

## 2021-08-06 ENCOUNTER — Ambulatory Visit
Admission: RE | Admit: 2021-08-06 | Discharge: 2021-08-06 | Disposition: A | Discharge status: Home / Self Care | Payer: Medicare Other | Source: Ambulatory Visit | Attending: Obstetrics and Gynecology | Admitting: Obstetrics and Gynecology

## 2021-08-06 DIAGNOSIS — Z1231 Encounter for screening mammogram for malignant neoplasm of breast: Secondary | ICD-10-CM

## 2022-06-26 ENCOUNTER — Other Ambulatory Visit: Payer: Self-pay | Admitting: Internal Medicine

## 2022-06-26 DIAGNOSIS — Z139 Encounter for screening, unspecified: Secondary | ICD-10-CM

## 2022-08-07 ENCOUNTER — Ambulatory Visit
Admission: RE | Admit: 2022-08-07 | Discharge: 2022-08-07 | Disposition: A | Discharge status: Home / Self Care | Payer: Medicare Other | Source: Ambulatory Visit | Attending: Internal Medicine | Admitting: Internal Medicine

## 2022-08-07 DIAGNOSIS — Z139 Encounter for screening, unspecified: Secondary | ICD-10-CM

## 2023-06-30 ENCOUNTER — Other Ambulatory Visit: Payer: Self-pay | Admitting: Internal Medicine

## 2023-06-30 DIAGNOSIS — Z1231 Encounter for screening mammogram for malignant neoplasm of breast: Secondary | ICD-10-CM

## 2023-08-09 ENCOUNTER — Ambulatory Visit
Admission: RE | Admit: 2023-08-09 | Discharge: 2023-08-09 | Disposition: A | Discharge status: Home / Self Care | Payer: Medicare Other | Source: Ambulatory Visit | Attending: Internal Medicine | Admitting: Internal Medicine

## 2023-08-09 DIAGNOSIS — Z1231 Encounter for screening mammogram for malignant neoplasm of breast: Secondary | ICD-10-CM

## 2024-07-11 ENCOUNTER — Other Ambulatory Visit: Payer: Self-pay | Admitting: Internal Medicine

## 2024-07-11 DIAGNOSIS — Z1231 Encounter for screening mammogram for malignant neoplasm of breast: Secondary | ICD-10-CM

## 2024-08-10 ENCOUNTER — Ambulatory Visit
Admission: RE | Admit: 2024-08-10 | Discharge: 2024-08-10 | Disposition: A | Source: Ambulatory Visit | Attending: Internal Medicine | Admitting: Internal Medicine

## 2024-08-10 DIAGNOSIS — Z1231 Encounter for screening mammogram for malignant neoplasm of breast: Secondary | ICD-10-CM
